# Patient Record
Sex: Male | Born: 1975 | Race: White | Hispanic: No | Marital: Married | Smoking: Never smoker
Health system: Southern US, Community
[De-identification: ages and names within clinical notes are randomized; demographics above are authoritative.]

## PROBLEM LIST (undated history)

## (undated) DIAGNOSIS — I10 Essential (primary) hypertension: Secondary | ICD-10-CM

---

## 1997-11-16 ENCOUNTER — Emergency Department (HOSPITAL_COMMUNITY): Admission: EM | Admit: 1997-11-16 | Discharge: 1997-11-17 | Payer: Self-pay | Admitting: Emergency Medicine

## 1997-11-23 ENCOUNTER — Ambulatory Visit (HOSPITAL_COMMUNITY): Admission: RE | Admit: 1997-11-23 | Discharge: 1997-11-23 | Payer: Self-pay | Admitting: Internal Medicine

## 1998-05-14 ENCOUNTER — Emergency Department (HOSPITAL_COMMUNITY): Admission: EM | Admit: 1998-05-14 | Discharge: 1998-05-14 | Payer: Self-pay | Admitting: Emergency Medicine

## 2005-06-16 ENCOUNTER — Emergency Department (HOSPITAL_COMMUNITY): Admission: EM | Admit: 2005-06-16 | Discharge: 2005-06-17 | Payer: Self-pay | Admitting: Emergency Medicine

## 2017-02-21 ENCOUNTER — Emergency Department (HOSPITAL_COMMUNITY): Payer: BLUE CROSS/BLUE SHIELD

## 2017-02-21 ENCOUNTER — Encounter (HOSPITAL_COMMUNITY): Payer: Self-pay | Admitting: Emergency Medicine

## 2017-02-21 ENCOUNTER — Emergency Department (HOSPITAL_COMMUNITY)
Admission: EM | Admit: 2017-02-21 | Discharge: 2017-02-21 | Disposition: A | Discharge status: Home / Self Care | Payer: BLUE CROSS/BLUE SHIELD | Attending: Emergency Medicine | Admitting: Emergency Medicine

## 2017-02-21 ENCOUNTER — Other Ambulatory Visit: Payer: Self-pay

## 2017-02-21 DIAGNOSIS — I10 Essential (primary) hypertension: Secondary | ICD-10-CM | POA: Insufficient documentation

## 2017-02-21 DIAGNOSIS — R079 Chest pain, unspecified: Secondary | ICD-10-CM | POA: Diagnosis not present

## 2017-02-21 DIAGNOSIS — Z79899 Other long term (current) drug therapy: Secondary | ICD-10-CM | POA: Insufficient documentation

## 2017-02-21 DIAGNOSIS — R0789 Other chest pain: Secondary | ICD-10-CM | POA: Diagnosis not present

## 2017-02-21 HISTORY — DX: Essential (primary) hypertension: I10

## 2017-02-21 LAB — CBC
HEMATOCRIT: 47.1 % (ref 39.0–52.0)
Hemoglobin: 16.2 g/dL (ref 13.0–17.0)
MCH: 32.5 pg (ref 26.0–34.0)
MCHC: 34.4 g/dL (ref 30.0–36.0)
MCV: 94.6 fL (ref 78.0–100.0)
PLATELETS: 312 10*3/uL (ref 150–400)
RBC: 4.98 MIL/uL (ref 4.22–5.81)
RDW: 12.4 % (ref 11.5–15.5)
WBC: 8.4 10*3/uL (ref 4.0–10.5)

## 2017-02-21 LAB — I-STAT TROPONIN, ED
TROPONIN I, POC: 0 ng/mL (ref 0.00–0.08)
Troponin i, poc: 0 ng/mL (ref 0.00–0.08)

## 2017-02-21 LAB — BASIC METABOLIC PANEL
ANION GAP: 8 (ref 5–15)
BUN: 11 mg/dL (ref 6–20)
CHLORIDE: 103 mmol/L (ref 101–111)
CO2: 25 mmol/L (ref 22–32)
CREATININE: 0.96 mg/dL (ref 0.61–1.24)
Calcium: 9.6 mg/dL (ref 8.9–10.3)
GFR calc non Af Amer: >60 mL/min (ref >60)
Glucose, Bld: 107 mg/dL — ABNORMAL HIGH (ref 65–99)
Potassium: 4 mmol/L (ref 3.5–5.1)
SODIUM: 136 mmol/L (ref 135–145)

## 2017-02-21 MED ORDER — LISINOPRIL 10 MG PO TABS: 10.0000 mg | ORAL_TABLET | Freq: Every day | ORAL | 0 refills | Status: AC

## 2017-02-21 NOTE — Discharge Instructions (Signed)
Return if any problems. Schedule appointment for recheck of your blood pressure.

## 2017-02-21 NOTE — ED Triage Notes (Signed)
To ed via private vehicle , with c/o increased BP today and chest pain-- midsternal, intermittent-- was seen by company nurse and encouraged to here.

## 2017-02-22 NOTE — ED Provider Notes (Signed)
[facility]   CSN: [id number] Arrival date & time: 02/21/17  1117     History   Chief Complaint Chief Complaint  Patient presents with  . Chest Pain  . Hypertension    HPI Samuel Wilson is a 41 y.o. male.  The history is provided by the patient. No language interpreter was used.  Chest Pain   This is a new problem. The problem has been resolved. The pain is associated with movement. The pain is moderate. The pain does not radiate. There are no known risk factors.  His past medical history is significant for hypertension.  His family medical history is significant for hypertension.  Hypertension  Associated symptoms include chest pain.   Patient reports he had some soreness in his mid chest today he went to see the company nurse who took his blood pressure and advised him to come to the emergency department because his blood pressure was elevated.  Patient denies any current chest discomfort.  Patient reports blood pressure has improved some since being here.  He denies any other complaints Past Medical History:  Diagnosis Date  . Hypertension     There are no active problems to display for this patient.   History reviewed. No pertinent surgical history.     Home Medications    Prior to Admission medications   Medication Sig Start Date End Date Taking? Authorizing Provider  lisinopril (PRINIVIL,ZESTRIL) 10 MG tablet Take 1 tablet (10 mg total) daily by mouth. 02/21/17   Elson AreasSofia, Leslie K, PA-C    Family History No family history on file.  Social History Social History   Tobacco Use  . Smoking status: Never Smoker  . Smokeless tobacco: Never Used  Substance Use Topics  . Alcohol use: Yes    Comment: 2-3 daily  . Drug use: No     Allergies   Patient has no allergy information on record.   Review of Systems Review of Systems  Cardiovascular: Positive for chest pain.  All other systems reviewed and  are negative.    Physical Exam Updated Vital Signs BP (!) 146/89   Pulse 100   Temp 99 F (37.2 C) (Oral)   Resp 16   Ht 5\' 7"  (1.702 m)   Wt 81.6 kg (180 lb)   SpO2 98%   BMI 28.19 kg/m   Physical Exam  Constitutional: He appears well-developed and well-nourished.  HENT:  Head: Normocephalic and atraumatic.  Eyes: Conjunctivae are normal.  Neck: Neck supple.  Cardiovascular: Normal rate and regular rhythm.  No murmur heard. Pulmonary/Chest: Effort normal and breath sounds normal. No respiratory distress.  Abdominal: Soft. There is no tenderness.  Musculoskeletal: Normal range of motion. He exhibits no edema.  Neurological: He is alert.  Skin: Skin is warm and dry.  Psychiatric: He has a normal mood and affect.  Nursing note and vitals reviewed.    ED Treatments / Results  Labs (all labs ordered are listed, but only abnormal results are displayed) Labs Reviewed  BASIC METABOLIC PANEL - Abnormal; Notable for the following components:      Result Value   Glucose, Bld 107 (*)    All other components within normal limits  CBC  I-STAT TROPONIN, ED  I-STAT TROPONIN, ED    EKG  EKG Interpretation None       Radiology Dg Chest 2 View  Result Date: 02/21/2017 CLINICAL DATA:  Chest pain EXAM: CHEST  2 VIEW COMPARISON:  None.  FINDINGS: Normal heart size. Normal mediastinal contour. No pneumothorax. No pleural effusion. Lungs appear clear, with no acute consolidative airspace disease and no pulmonary edema. IMPRESSION: No active cardiopulmonary disease. Electronically Signed   By: Delbert PhenixJason A Poff M.D.   On: 02/21/2017 12:13    Procedures Procedures (including critical care time)  Medications Ordered in ED Medications - No data to display   Initial Impression / Assessment and Plan / ED Course  I have reviewed the triage vital signs and the nursing notes.  Pertinent labs & imaging results that were available during my care of the patient were reviewed by me and  considered in my medical decision making (see chart for details).    Troponin is negative x2.  EKG is nonacute patient has a normal chest x-ray he is currently pain-free he does continue to have slightly elevated blood pressure I doubt MI or acute coronary disease.  She does not have a primary care doctor I will start him on lisinopril for his blood pressure he is advised to establish with a primary care physician for further evaluation he is to return to the emergency department if symptoms worsen or change  Final Clinical Impressions(s) / ED Diagnoses   Final diagnoses:  Hypertension, unspecified type  Atypical chest pain    ED Discharge Orders        Ordered    lisinopril (PRINIVIL,ZESTRIL) 10 MG tablet  Daily     02/21/17 1724    An After Visit Summary was printed and given to the patient.    Elson AreasSofia, Leslie K, PA-C 02/22/17 1324    Loren RacerYelverton, David, MD 02/27/17 [phone]

## 2017-03-20 DIAGNOSIS — R030 Elevated blood-pressure reading, without diagnosis of hypertension: Secondary | ICD-10-CM | POA: Diagnosis not present

## 2017-03-20 DIAGNOSIS — E7800 Pure hypercholesterolemia, unspecified: Secondary | ICD-10-CM | POA: Diagnosis not present

## 2017-10-02 DIAGNOSIS — Z0000 Encounter for general adult medical examination without abnormal findings: Secondary | ICD-10-CM | POA: Diagnosis not present

## 2018-03-19 DIAGNOSIS — R1011 Right upper quadrant pain: Secondary | ICD-10-CM | POA: Diagnosis not present

## 2018-04-16 DIAGNOSIS — E782 Mixed hyperlipidemia: Secondary | ICD-10-CM | POA: Diagnosis not present

## 2018-04-16 DIAGNOSIS — Z800 Family history of malignant neoplasm of digestive organs: Secondary | ICD-10-CM | POA: Diagnosis not present

## 2018-05-06 DIAGNOSIS — R1011 Right upper quadrant pain: Secondary | ICD-10-CM | POA: Diagnosis not present

## 2018-05-08 ENCOUNTER — Other Ambulatory Visit: Payer: Self-pay | Admitting: Gastroenterology

## 2018-05-08 DIAGNOSIS — R1011 Right upper quadrant pain: Secondary | ICD-10-CM

## 2018-05-21 ENCOUNTER — Ambulatory Visit
Admission: RE | Admit: 2018-05-21 | Discharge: 2018-05-21 | Disposition: A | Discharge status: Home / Self Care | Payer: BLUE CROSS/BLUE SHIELD | Source: Ambulatory Visit | Attending: Gastroenterology | Admitting: Gastroenterology

## 2018-05-21 DIAGNOSIS — R1011 Right upper quadrant pain: Secondary | ICD-10-CM

## 2018-05-28 DIAGNOSIS — K648 Other hemorrhoids: Secondary | ICD-10-CM | POA: Diagnosis not present

## 2018-05-28 DIAGNOSIS — Z800 Family history of malignant neoplasm of digestive organs: Secondary | ICD-10-CM | POA: Diagnosis not present

## 2018-05-28 DIAGNOSIS — Z1211 Encounter for screening for malignant neoplasm of colon: Secondary | ICD-10-CM | POA: Diagnosis not present

## 2018-05-28 DIAGNOSIS — K5289 Other specified noninfective gastroenteritis and colitis: Secondary | ICD-10-CM | POA: Diagnosis not present

## 2018-05-31 DIAGNOSIS — K5289 Other specified noninfective gastroenteritis and colitis: Secondary | ICD-10-CM | POA: Diagnosis not present

## 2018-06-04 DIAGNOSIS — Z5181 Encounter for therapeutic drug level monitoring: Secondary | ICD-10-CM | POA: Diagnosis not present

## 2018-06-04 DIAGNOSIS — E7800 Pure hypercholesterolemia, unspecified: Secondary | ICD-10-CM | POA: Diagnosis not present

## 2018-12-04 ENCOUNTER — Emergency Department (HOSPITAL_COMMUNITY)
Admission: EM | Admit: 2018-12-04 | Discharge: 2018-12-05 | Disposition: A | Discharge status: Home / Self Care | Payer: BC Managed Care – PPO | Attending: Emergency Medicine | Admitting: Emergency Medicine

## 2018-12-04 ENCOUNTER — Emergency Department (HOSPITAL_COMMUNITY): Payer: BC Managed Care – PPO

## 2018-12-04 ENCOUNTER — Encounter (HOSPITAL_COMMUNITY): Payer: Self-pay

## 2018-12-04 ENCOUNTER — Other Ambulatory Visit: Payer: Self-pay

## 2018-12-04 DIAGNOSIS — R0789 Other chest pain: Secondary | ICD-10-CM | POA: Insufficient documentation

## 2018-12-04 DIAGNOSIS — I10 Essential (primary) hypertension: Secondary | ICD-10-CM | POA: Insufficient documentation

## 2018-12-04 DIAGNOSIS — R1013 Epigastric pain: Secondary | ICD-10-CM | POA: Insufficient documentation

## 2018-12-04 DIAGNOSIS — R079 Chest pain, unspecified: Secondary | ICD-10-CM | POA: Diagnosis not present

## 2018-12-04 LAB — BASIC METABOLIC PANEL
Anion gap: 12 (ref 5–15)
BUN: 14 mg/dL (ref 6–20)
CO2: 23 mmol/L (ref 22–32)
Calcium: 9.2 mg/dL (ref 8.9–10.3)
Chloride: 101 mmol/L (ref 98–111)
Creatinine, Ser: 1.04 mg/dL (ref 0.61–1.24)
GFR calc Af Amer: >60 mL/min (ref >60)
GFR calc non Af Amer: >60 mL/min (ref >60)
Glucose, Bld: 108 mg/dL — ABNORMAL HIGH (ref 70–99)
Potassium: 3.5 mmol/L (ref 3.5–5.1)
Sodium: 136 mmol/L (ref 135–145)

## 2018-12-04 LAB — CBC
HCT: 45.2 % (ref 39.0–52.0)
Hemoglobin: 14.5 g/dL (ref 13.0–17.0)
MCH: 31.3 pg (ref 26.0–34.0)
MCHC: 32.1 g/dL (ref 30.0–36.0)
MCV: 97.4 fL (ref 80.0–100.0)
Platelets: 305 10*3/uL (ref 150–400)
RBC: 4.64 MIL/uL (ref 4.22–5.81)
RDW: 12.3 % (ref 11.5–15.5)
WBC: 6.2 10*3/uL (ref 4.0–10.5)
nRBC: 0 % (ref 0.0–0.2)

## 2018-12-04 LAB — TROPONIN I (HIGH SENSITIVITY)
Troponin I (High Sensitivity): 3 ng/L (ref <18)
Troponin I (High Sensitivity): 4 ng/L (ref <18)

## 2018-12-04 NOTE — ED Triage Notes (Signed)
Pt arrives POV for eval of L sided CP onset about 1800. Pt reports it feels like "electrical pulsing". States hx of same pain that spontaneous resolved. Denies SOB, N/V, diaphoresis

## 2018-12-05 NOTE — ED Provider Notes (Signed)
Emergency Department Provider Note   I have reviewed the triage vital signs and the nursing notes.   HISTORY  Chief Complaint Chest Pain   HPI Samuel Wilson is a 43 y.o. male with a history of hyperlipidemia who presents the emerge department today secondary chest pain.  Patient states that he went to sit down to eat and before he could eat anything he had an epigastric left-sided chest pain that felt like a squeezing type sensation that would come and go over a few minutes.  Patient states that the shower did not help.  He did not try anything else for his symptoms.  He is symptom-free at this point after wait in the emergency room for multiple hours.  No other associated or modifying symptoms.    Past Medical History:  Diagnosis Date   Hypertension     There are no active problems to display for this patient.   History reviewed. No pertinent surgical history.  Current Outpatient Rx   Order #: [id number] Class: Print    Allergies Patient has no known allergies.  History reviewed. No pertinent family history.  Social History Social History   Tobacco Use   Smoking status: Never Smoker   Smokeless tobacco: Never Used  Substance Use Topics   Alcohol use: Yes    Comment: 2-3 daily   Drug use: No    Review of Systems  All other systems negative except as documented in the HPI. All pertinent positives and negatives as reviewed in the HPI. ____________________________________________   PHYSICAL EXAM:  VITAL SIGNS: ED Triage Vitals  Enc Vitals Group     BP 12/04/18 1921 (!) 141/93     Pulse Rate 12/04/18 1921 74     Resp 12/04/18 1921 16     Temp 12/04/18 1921 98.2 F (36.8 C)     Temp Source 12/04/18 1921 Oral     SpO2 12/04/18 1921 96 %     Weight 12/04/18 1920 175 lb (79.4 kg)     Height 12/04/18 1920 5\' 7"  (1.702 m)     Head Circumference --      Peak Flow --      Pain Score 12/04/18 1919 7     Pain Loc --      Pain Edu? --      Excl. in  [place]? --     Constitutional: Alert and oriented. Well appearing and in no acute distress. Eyes: Conjunctivae are normal. PERRL. EOMI. Head: Atraumatic. Nose: No congestion/rhinnorhea. Mouth/Throat: Mucous membranes are moist.  Oropharynx non-erythematous. Neck: No stridor.  No meningeal signs.   Cardiovascular: Normal rate, regular rhythm. Good peripheral circulation. Grossly normal heart sounds.   Respiratory: Normal respiratory effort.  No retractions. Lungs CTAB. Gastrointestinal: Soft and nontender. No distention.  Musculoskeletal: No lower extremity tenderness nor edema. No gross deformities of extremities. Neurologic:  Normal speech and language. No gross focal neurologic deficits are appreciated.  Skin:  Skin is warm, dry and intact. No rash noted.  ____________________________________________   LABS (all labs ordered are listed, but only abnormal results are displayed)  Labs Reviewed  BASIC METABOLIC PANEL - Abnormal; Notable for the following components:      Result Value   Glucose, Bld 108 (*)    All other components within normal limits  CBC  TROPONIN I (HIGH SENSITIVITY)  TROPONIN I (HIGH SENSITIVITY)   ____________________________________________  EKG   EKG Interpretation  Date/Time:  Wednesday December 04 2018 19:17:34 EDT Ventricular Rate:  78 PR  Interval:  164 QRS Duration: 102 QT Interval:  384 QTC Calculation: 437 R Axis:   68 Text Interpretation:  Normal sinus rhythm Incomplete right bundle branch block Borderline ECG improved nonspecific T waves in III since 2018 Confirmed by Marily MemosMesner, Shadoe [phone]([id number]) on 12/05/2018 1:23:34 AM       ____________________________________________  RADIOLOGY  Dg Chest 2 View  Result Date: 12/04/2018 CLINICAL DATA:  43 year old male with left-sided chest pain. EXAM: CHEST - 2 VIEW COMPARISON:  Chest radiograph dated 02/21/2017 FINDINGS: The heart size and mediastinal contours are within normal limits. Both lungs are clear.  The visualized skeletal structures are unremarkable. IMPRESSION: No active cardiopulmonary disease. Electronically Signed   By: Elgie CollardArash  Radparvar M.D.   On: 12/04/2018 21:09    ____________________________________________   PROCEDURES  Procedure(s) performed:   Procedures   ____________________________________________   INITIAL IMPRESSION / ASSESSMENT AND PLAN / ED COURSE  Chest pain work-up was overall reassuring.  No infectious type symptoms.  No other associated symptoms.  Unclear the etiology at this point so we will have him follow-up with his PCP but he is low heart score with negative troponins no indication for admission or emergent stress test at this time.   Pertinent labs & imaging results that were available during my care of the patient were reviewed by me and considered in my medical decision making (see chart for details).  A medical screening exam was performed and I feel the patient has had an appropriate workup for their chief complaint at this time and likelihood of emergent condition existing is low. They have been counseled on decision, discharge, follow up and which symptoms necessitate immediate return to the emergency department. They or their family verbally stated understanding and agreement with plan and discharged in stable condition.   ____________________________________________  FINAL CLINICAL IMPRESSION(S) / ED DIAGNOSES  Final diagnoses:  Nonspecific chest pain     MEDICATIONS GIVEN DURING THIS VISIT:  Medications - No data to display   NEW OUTPATIENT MEDICATIONS STARTED DURING THIS VISIT:  New Prescriptions   No medications on file    Note:  This note was prepared with assistance of Dragon voice recognition software. Occasional wrong-word or sound-a-like substitutions may have occurred due to the inherent limitations of voice recognition software.   Lillianah Swartzentruber, Barbara CowerJason, MD 12/05/18 [phone]

## 2019-03-26 DIAGNOSIS — R5383 Other fatigue: Secondary | ICD-10-CM | POA: Diagnosis not present

## 2019-03-26 DIAGNOSIS — R030 Elevated blood-pressure reading, without diagnosis of hypertension: Secondary | ICD-10-CM | POA: Diagnosis not present

## 2019-03-26 DIAGNOSIS — Z23 Encounter for immunization: Secondary | ICD-10-CM | POA: Diagnosis not present

## 2019-04-16 DIAGNOSIS — R42 Dizziness and giddiness: Secondary | ICD-10-CM | POA: Diagnosis not present

## 2019-04-16 DIAGNOSIS — I10 Essential (primary) hypertension: Secondary | ICD-10-CM | POA: Diagnosis not present

## 2019-04-30 DIAGNOSIS — I10 Essential (primary) hypertension: Secondary | ICD-10-CM | POA: Diagnosis not present

## 2019-04-30 DIAGNOSIS — E7800 Pure hypercholesterolemia, unspecified: Secondary | ICD-10-CM | POA: Diagnosis not present

## 2019-04-30 DIAGNOSIS — F418 Other specified anxiety disorders: Secondary | ICD-10-CM | POA: Diagnosis not present

## 2019-05-29 DIAGNOSIS — R200 Anesthesia of skin: Secondary | ICD-10-CM | POA: Diagnosis not present

## 2019-05-29 DIAGNOSIS — F419 Anxiety disorder, unspecified: Secondary | ICD-10-CM | POA: Diagnosis not present

## 2019-05-29 DIAGNOSIS — I10 Essential (primary) hypertension: Secondary | ICD-10-CM | POA: Diagnosis not present

## 2019-06-27 DIAGNOSIS — E782 Mixed hyperlipidemia: Secondary | ICD-10-CM | POA: Diagnosis not present

## 2019-06-27 DIAGNOSIS — L508 Other urticaria: Secondary | ICD-10-CM | POA: Diagnosis not present

## 2019-06-27 DIAGNOSIS — I10 Essential (primary) hypertension: Secondary | ICD-10-CM | POA: Diagnosis not present

## 2019-06-27 DIAGNOSIS — R200 Anesthesia of skin: Secondary | ICD-10-CM | POA: Diagnosis not present

## 2019-07-24 DIAGNOSIS — Z202 Contact with and (suspected) exposure to infections with a predominantly sexual mode of transmission: Secondary | ICD-10-CM | POA: Diagnosis not present

## 2019-07-24 DIAGNOSIS — Z113 Encounter for screening for infections with a predominantly sexual mode of transmission: Secondary | ICD-10-CM | POA: Diagnosis not present

## 2019-07-25 DIAGNOSIS — Z202 Contact with and (suspected) exposure to infections with a predominantly sexual mode of transmission: Secondary | ICD-10-CM | POA: Diagnosis not present

## 2019-07-26 DIAGNOSIS — Z23 Encounter for immunization: Secondary | ICD-10-CM | POA: Diagnosis not present

## 2019-08-25 DIAGNOSIS — R0789 Other chest pain: Secondary | ICD-10-CM | POA: Diagnosis not present

## 2019-08-25 DIAGNOSIS — R0683 Snoring: Secondary | ICD-10-CM | POA: Diagnosis not present

## 2019-08-25 DIAGNOSIS — E782 Mixed hyperlipidemia: Secondary | ICD-10-CM | POA: Diagnosis not present

## 2019-08-25 DIAGNOSIS — I10 Essential (primary) hypertension: Secondary | ICD-10-CM | POA: Diagnosis not present

## 2019-09-19 DIAGNOSIS — Z0000 Encounter for general adult medical examination without abnormal findings: Secondary | ICD-10-CM | POA: Diagnosis not present

## 2019-09-19 DIAGNOSIS — I10 Essential (primary) hypertension: Secondary | ICD-10-CM | POA: Diagnosis not present

## 2019-09-19 DIAGNOSIS — E782 Mixed hyperlipidemia: Secondary | ICD-10-CM | POA: Diagnosis not present

## 2020-03-15 DIAGNOSIS — E782 Mixed hyperlipidemia: Secondary | ICD-10-CM | POA: Diagnosis not present

## 2020-03-15 DIAGNOSIS — I10 Essential (primary) hypertension: Secondary | ICD-10-CM | POA: Diagnosis not present

## 2020-04-17 DIAGNOSIS — Z03818 Encounter for observation for suspected exposure to other biological agents ruled out: Secondary | ICD-10-CM | POA: Diagnosis not present

## 2020-06-10 IMAGING — DX CHEST - 2 VIEW
2 series · 2 of 2 positions shown · non-contrast
Comparison: Chest radiograph dated 02/21/2017

CLINICAL DATA: 43-year-old male with left-sided chest pain.

EXAM:
CHEST - 2 VIEW

[chest pa]
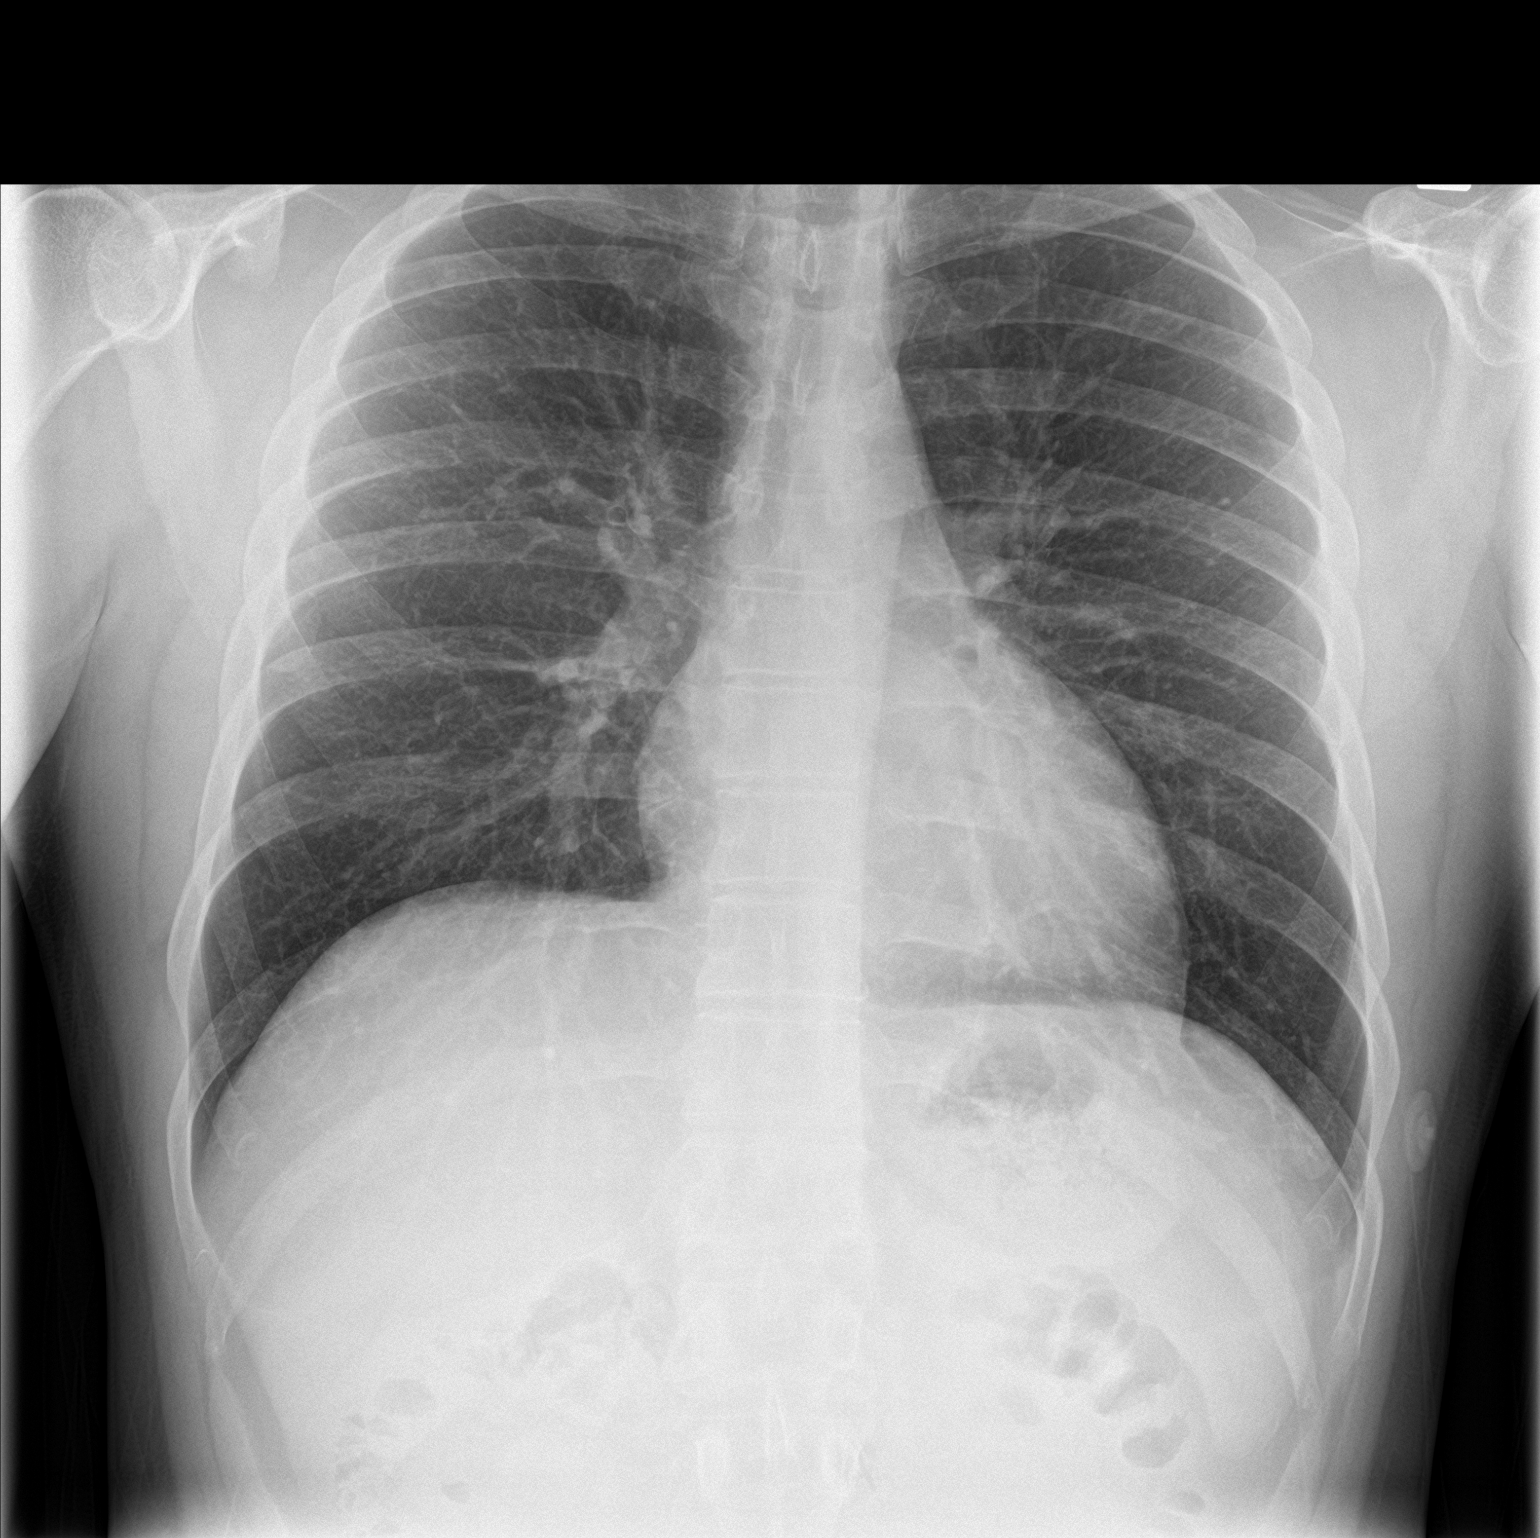

[chest lat]
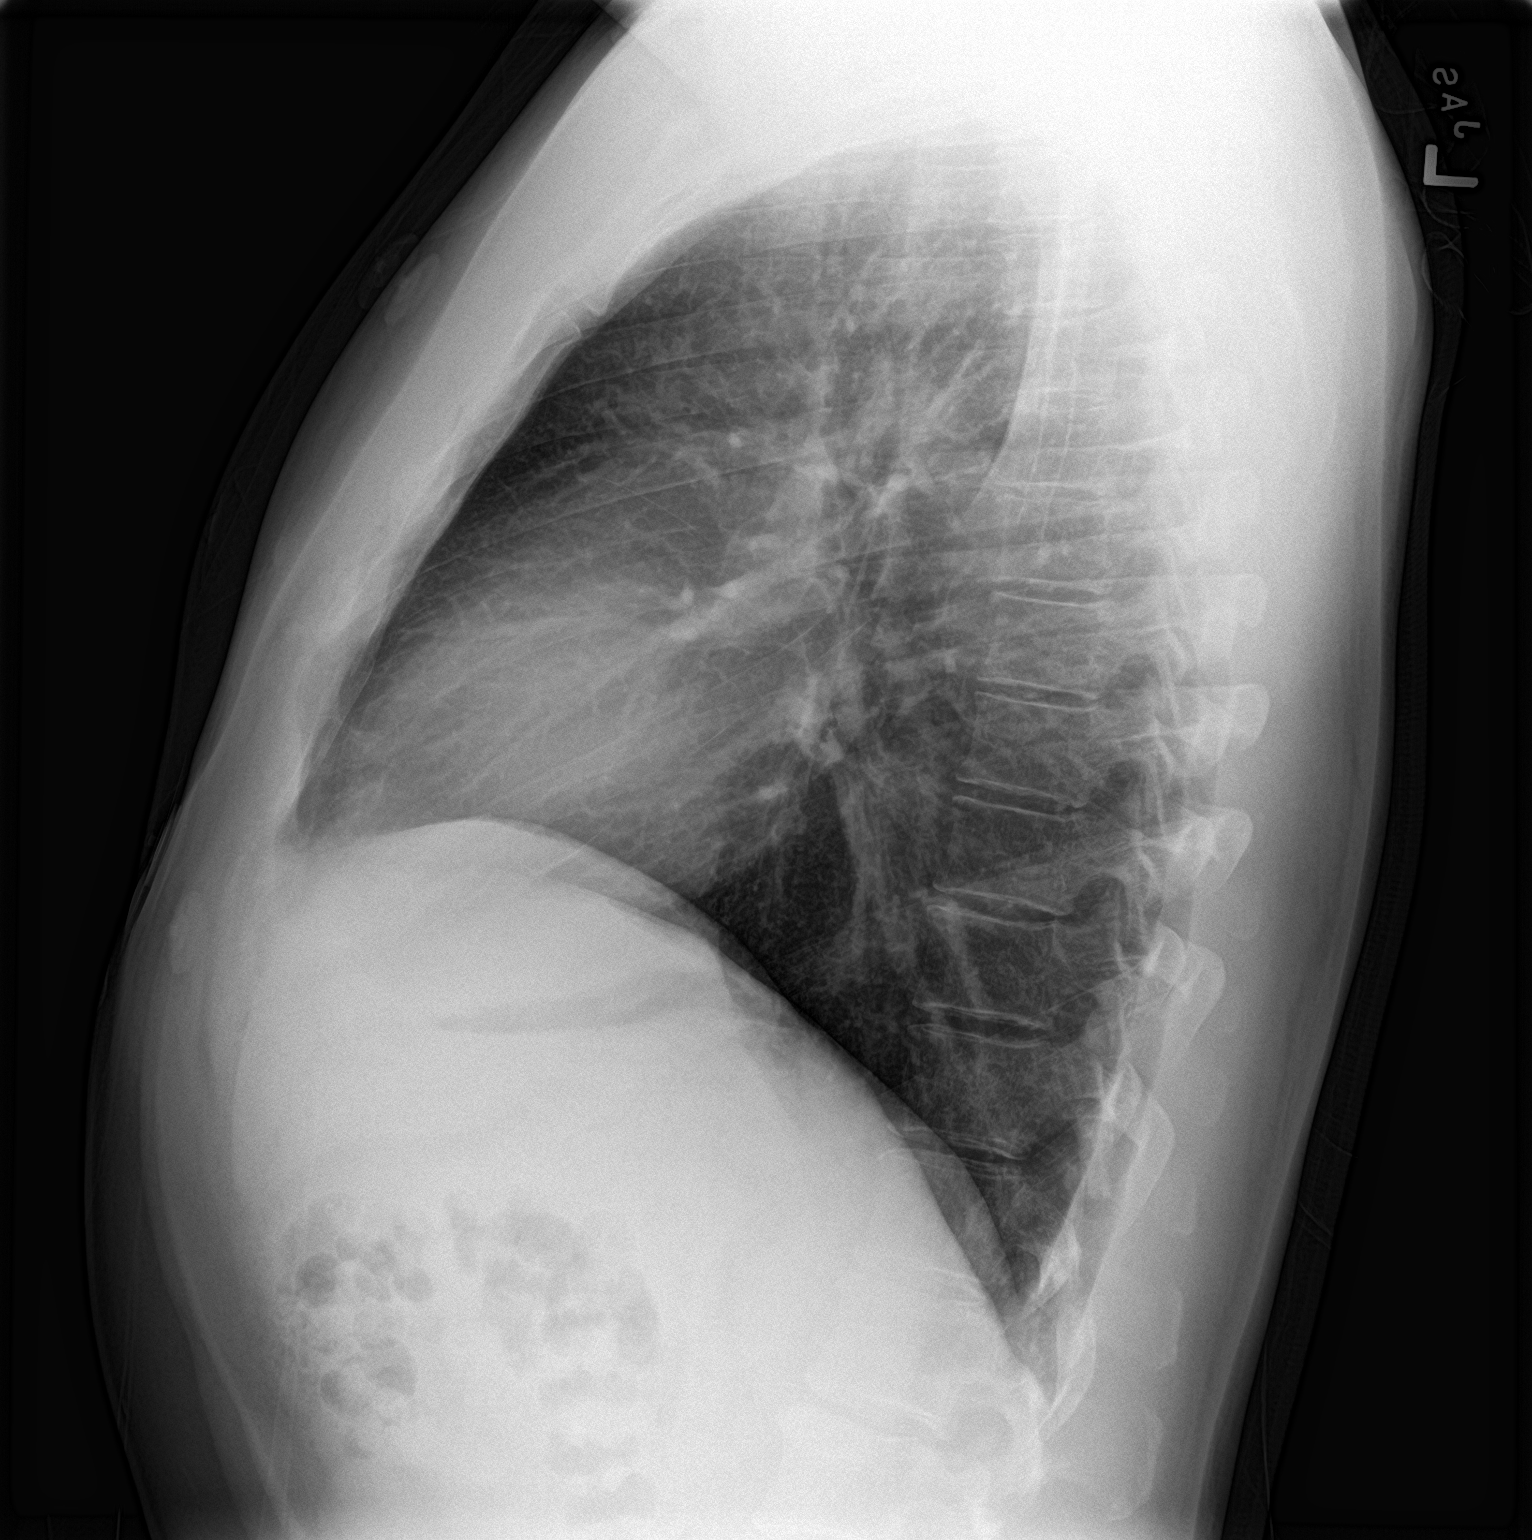

[2 of 2 positions shown; findings below may reference images not displayed]

FINDINGS: The heart size and mediastinal contours are within normal limits.
Both lungs are clear. The visualized skeletal structures are
unremarkable.
IMPRESSION: No active cardiopulmonary disease.

## 2020-09-22 DIAGNOSIS — Z0000 Encounter for general adult medical examination without abnormal findings: Secondary | ICD-10-CM | POA: Diagnosis not present

## 2020-09-22 DIAGNOSIS — E782 Mixed hyperlipidemia: Secondary | ICD-10-CM | POA: Diagnosis not present

## 2020-09-22 DIAGNOSIS — I10 Essential (primary) hypertension: Secondary | ICD-10-CM | POA: Diagnosis not present

## 2020-09-22 DIAGNOSIS — Z1159 Encounter for screening for other viral diseases: Secondary | ICD-10-CM | POA: Diagnosis not present

## 2021-03-22 DIAGNOSIS — I10 Essential (primary) hypertension: Secondary | ICD-10-CM | POA: Diagnosis not present

## 2021-03-22 DIAGNOSIS — Z23 Encounter for immunization: Secondary | ICD-10-CM | POA: Diagnosis not present

## 2021-03-22 DIAGNOSIS — K921 Melena: Secondary | ICD-10-CM | POA: Diagnosis not present

## 2021-12-30 DIAGNOSIS — E782 Mixed hyperlipidemia: Secondary | ICD-10-CM | POA: Diagnosis not present

## 2021-12-30 DIAGNOSIS — I10 Essential (primary) hypertension: Secondary | ICD-10-CM | POA: Diagnosis not present

## 2021-12-30 DIAGNOSIS — Z5181 Encounter for therapeutic drug level monitoring: Secondary | ICD-10-CM | POA: Diagnosis not present

## 2022-12-27 DIAGNOSIS — E782 Mixed hyperlipidemia: Secondary | ICD-10-CM | POA: Diagnosis not present

## 2022-12-27 DIAGNOSIS — Z0000 Encounter for general adult medical examination without abnormal findings: Secondary | ICD-10-CM | POA: Diagnosis not present

## 2022-12-27 DIAGNOSIS — I10 Essential (primary) hypertension: Secondary | ICD-10-CM | POA: Diagnosis not present

## 2024-01-22 DIAGNOSIS — E782 Mixed hyperlipidemia: Secondary | ICD-10-CM | POA: Diagnosis not present

## 2024-01-22 DIAGNOSIS — L723 Sebaceous cyst: Secondary | ICD-10-CM | POA: Diagnosis not present

## 2024-01-22 DIAGNOSIS — I10 Essential (primary) hypertension: Secondary | ICD-10-CM | POA: Diagnosis not present

## 2024-01-22 DIAGNOSIS — Z0000 Encounter for general adult medical examination without abnormal findings: Secondary | ICD-10-CM | POA: Diagnosis not present
